# Patient Record
Sex: Female | Born: 1935 | Race: White | Hispanic: No | Marital: Married
Health system: Midwestern US, Community
[De-identification: ages and names within clinical notes are randomized; demographics above are authoritative.]

## PROBLEM LIST (undated history)

## (undated) DIAGNOSIS — C4400 Unspecified malignant neoplasm of skin of lip: Secondary | ICD-10-CM

---

## 2009-12-22 ENCOUNTER — Other Ambulatory Visit

## 2011-04-27 LAB — TYPE AND SCREEN
ABO/Rh: A POS
Antibody Screen: NEGATIVE

## 2011-04-27 NOTE — Op Note (Signed)
Name:      Chelsey Tanner                                          Surgeon:        Carlyle Lipa,   M.D.  Account #: [id number]                 Surgery Date:   04/27/2011  DOB:       Jul 15, 1934  Age:       77                           Location:                                 OPERATIVE REPORT      PREOPERATIVE DIAGNOSIS: Femoral neck fracture, left.    POSTOPERATIVE DIAGNOSIS: Femoral neck fracture, left.    OPERATIVE PROCEDURE: Percutaneous screw fixation of left femoral neck  fracture.    SURGEON: Carlyle Lipa, MD    ASSISTANT: Vivien Rossetti    ANESTHESIA: General.    ESTIMATED BLOOD LOSS: Minimal.    COMPLICATIONS: None.    SPECIMEN: None.    PREOPERATIVE ANTIBIOTIC: Ancef.    INDICATIONS: This is a 77 year old woman who sustained a ground-level fall.  She was found to have a femoral neck fracture. It was nondisplaced. Due to  her Parkinson and propensity to falls, we discussed treatment options and  decided to proceed with screw fixation.    DESCRIPTION OF PROCEDURE: Anesthetic was initiated. Preoperative dose of  Ancef was given. Left side was confirmed as the operative side, prepped and  draped in the usual sterile fashion. Well leg was placed in the well leg  holder. Under fluoroscopic guidance and through a small 1 inch incision, 3  cannulated guide pins were placed into the femoral neck, 2 along the  posterior cortex, one anterior and one inferior and one along the posterior  cortex anteriorly and inferiorly to create an inverted triangle-type  pattern to stabilize the posterior cortex and inferior cortex.    Confirmed guide pin placement with fluoroscopy then placed 3 short thread  cannulated screws. All had excellent bite. There were no complications. No  specimen. Triplanar fluoroscopy was used to confirm placement of the  screws. No complications, no specimen. To the recovery room in stable  condition after closure of the skin with 2-0 Vicryl sutures, Monocryl  suture and  Steri-Strips.          Carlyle Lipa, M.D.    cc:   Carlyle Lipa, M.D.        MAD/wmx; D: 04/27/2011 09:35 A; T: 04/27/2011 11:16 A; Doc# [id number]; Job#  [id number]

## 2011-06-18 LAB — TYPE AND SCREEN
ABO/Rh: A POS
Antibody Screen: NEGATIVE

## 2011-06-19 NOTE — Op Note (Signed)
Name:      Chelsey Tanner, Chelsey Tanner                                          Surgeon:        Carlyle Lipa,   MD  Account #: [id number]                 Surgery Date:   06/18/2011  DOB:       03/10/1934  Age:       77                           Location:                                 OPERATIVE REPORT      PREOPERATIVE DIAGNOSES  1.  Femoral neck fracture, left hip.  2.  Nonunion.    POSTOPERATIVE DIAGNOSES  1.  Femoral neck fracture, left hip.  2.  Nonunion.    PROCEDURES PERFORMED  1.  Deep hardware removal.  2.  Hemiarthroplasty, left hip.    SURGEON: Carlyle Lipa, MD    ASSISTANT: Francesco Sor, PA-C    ANESTHESIA: General.    ESTIMATED BLOOD LOSS: 800 mL.    SPECIMENS REMOVED: None.    IMPLANT: DePuy bipolar hemiarthroplasty size 2 standard offset.    COUNTS: Sponge, instrument and needle count were correct at the end of the  procedure.    INDICATIONS: A 77 year old woman who has Parkinson disease. She has  sustained multiple falls. Her first fall resulted in an occult femoral neck  fracture. This was treated with percutaneous screw fixation.  Postoperatively, she pretty much immediately fell. The fixation appeared  sound several month into the healing. She has had multiple falls and  increased pain. She presented to the office with failure of fixation of her  left femoral neck fracture.    Options were discussed and we decided to proceed with hemiarthroplasty.    DESCRIPTION OF PROCEDURE: Anesthetic was initiated. Preoperative dose of  antibiotic was given. Foley catheter was placed. She was turned lateral.  The left side was confirmed as the operative side, prepped and draped in  the usual sterile fashion. Antibiotics within 30 minutes prior to skin  incision.    The hip was exposed through an anterior lateral exposure. The hip was  dislocated and then relocated. Fixation was removed from the femoral neck.  We removed 3 deep cannulated screws. The hip was then redislocated. Femoral  neck was  osteotomized with an oscillating saw. The calcar was intact.  Medullary canal was entered, reamed to a 2, broached to a 2, which was  rotationally and axially stable. A +5 hip ball equalized leg lengths. The  hip was trialed with a 47 bipolar and was stable. The hip was redislocated.  The acetabulum and bony canal were lavaged.  The stem was impacted. The  real hip ball was placed. The hip was copiously irrigated with pulsatile  lavage. I reduced the head into the acetabulum.  After copious irrigation,  the capsule was closed with #2 Vicryl sutures, irrigated again, closed the  gluteus medius and minimus through drill holes in the greater trochanter  with #2 Vicryl sutures in figure-of-eight fashion. The deep wound was then  copiously irrigated. IT band and gluteus maximus were closed with #2 Vicryl  sutures and 0 Vicryl sutures watertight. Skin and subcutaneous were  irrigated and closed in standard fashion. Sterile dressing was applied.  There were no complications. No specimen.  Procedure was removal of  hardware, deep, and conversion to hemiarthroplasty, left hip. The patient  was awakened from anesthetic and taken to the recovery room stable.  Preoperative antibiotic IV dose was given.  Francesco Sor assisted with  the procedure.          Carlyle Lipa, MD    cc:   Carlyle Lipa, MD        MAD/wmx; D: 06/18/2011 04:58 P; T: 06/19/2011 04:21 A; Doc# [id number]; Job#  [id number]

## 2011-11-04 LAB — CULTURE, URINE

## 2011-12-13 LAB — CULTURE, URINE

## 2012-03-14 LAB — CULTURE, URINE

## 2012-05-31 LAB — CULTURE, URINE

## 2019-03-30 DEATH — deceased
